# Patient Record
Sex: Male | Born: 2000 | Race: White | Hispanic: Yes | Marital: Single | Smoking: Former smoker
Health system: Southern US, Community
[De-identification: ages and names within clinical notes are randomized; demographics above are authoritative.]

## PROBLEM LIST (undated history)

## (undated) DIAGNOSIS — S060XAA Concussion with loss of consciousness status unknown, initial encounter: Secondary | ICD-10-CM

## (undated) DIAGNOSIS — S060X9A Concussion with loss of consciousness of unspecified duration, initial encounter: Secondary | ICD-10-CM

## (undated) DIAGNOSIS — I10 Essential (primary) hypertension: Secondary | ICD-10-CM

## (undated) HISTORY — PX: NO PAST SURGERIES: SHX2092

## (undated) HISTORY — DX: Essential (primary) hypertension: I10

## (undated) HISTORY — DX: Concussion with loss of consciousness status unknown, initial encounter: S06.0XAA

## (undated) HISTORY — DX: Concussion with loss of consciousness of unspecified duration, initial encounter: S06.0X9A

---

## 2018-03-27 ENCOUNTER — Other Ambulatory Visit: Payer: Self-pay | Admitting: Family Medicine

## 2018-03-27 DIAGNOSIS — S060X0A Concussion without loss of consciousness, initial encounter: Secondary | ICD-10-CM

## 2018-04-02 ENCOUNTER — Ambulatory Visit
Admission: RE | Admit: 2018-04-02 | Discharge: 2018-04-02 | Disposition: A | Discharge status: Home / Self Care | Payer: Managed Care, Other (non HMO) | Source: Ambulatory Visit | Attending: Family Medicine | Admitting: Family Medicine

## 2018-04-02 DIAGNOSIS — S060X0A Concussion without loss of consciousness, initial encounter: Secondary | ICD-10-CM

## 2018-04-05 ENCOUNTER — Ambulatory Visit (INDEPENDENT_AMBULATORY_CARE_PROVIDER_SITE_OTHER): Payer: Managed Care, Other (non HMO) | Admitting: Neurology

## 2018-04-05 ENCOUNTER — Encounter: Payer: Self-pay | Admitting: Neurology

## 2018-04-05 VITALS — BP 125/85 | HR 72 | Ht 75.75 in | Wt 280.0 lb

## 2018-04-05 DIAGNOSIS — IMO0002 Reserved for concepts with insufficient information to code with codable children: Secondary | ICD-10-CM | POA: Insufficient documentation

## 2018-04-05 DIAGNOSIS — G43709 Chronic migraine without aura, not intractable, without status migrainosus: Secondary | ICD-10-CM

## 2018-04-05 MED ORDER — NORTRIPTYLINE HCL 10 MG PO CAPS: 20.0000 mg | ORAL_CAPSULE | Freq: Every day | ORAL | 11 refills | Status: AC

## 2018-04-05 MED ORDER — SUMATRIPTAN SUCCINATE 50 MG PO TABS: 50.0000 mg | ORAL_TABLET | ORAL | 6 refills | Status: AC | PRN

## 2018-04-05 NOTE — Progress Notes (Signed)
PATIENT: Jacob Day DOB: 2001/01/15  Chief Complaint  Patient presents with  . New Patient (Initial Visit)    New room.   Marland Kitchen Headache    Had concussion on 03/01/18. Having headaches sometimes. Getting them every week.      HISTORICAL  Jacob Day is a 17 years old male, seen in request by his primary care physician Dr. Althea Charon, Dominic for evaluation of headaches, initial evaluation was on April 05, 2018. He is accompanied by his atheletic trainer Dafne Katsarou at today's clinical visit.  I have reviewed and summarized the referring note from the referring physician.  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  Since the injury, he complains of excessive fatigue, but also reported that he tends to stay up late watching TV, is able to go back to his academic course without significant difficulty, but he was put off his practice, he began to ease into his practice regularly, but he continue complains of head pressure sensation, 5 out of 10, it does not limit his daily activities.  He reported history of occasional headache in the past, some of the headache has migraine features, lateralized severe pounding headache with associated light noise sensitivity, movement made it worse, he tends to lie down in dark quiet room resting,  I personally reviewed CT head without contrast on April 02, 2018 was normal, other than mild thickening of the ethmoid air cells  REVIEW OF SYSTEMS: Full 14 system review of systems performed and notable only for fatigue, double vision, blurry vision, headaches, dizziness, depression, anxiety decreased  energy, disinterested in activities, racing thoughts All other review of systems were negative.  ALLERGIES: No Known Allergies  HOME MEDICATIONS: No current outpatient medications on file.   No current facility-administered medications for this visit.     PAST MEDICAL HISTORY: Past Medical History:  Diagnosis Date  . Concussion   . Hypertension     PAST SURGICAL HISTORY: Past Surgical History:  Procedure Laterality Date  . NO PAST SURGERIES      FAMILY HISTORY: History reviewed. No pertinent family history.  SOCIAL HISTORY: Social History   Socioeconomic History  . Marital status: Single    Spouse name: Not on file  . Number of children: 0  . Years of education: Current student  . Highest education level: Not on file  Occupational History  . Occupation: Consulting civil engineer  Social Needs  . Financial resource strain: Not on file  . Food insecurity:    Worry: Not on file    Inability: Not on file  . Transportation needs:    Medical: Not on file    Non-medical: Not on file  Tobacco Use  . Smoking status: Former Smoker  Substance and Sexual Activity  . Alcohol use: Yes    Comment: 12-15 beers per month  . Drug use: Never  . Sexual activity: Not on file  Lifestyle  . Physical activity:    Days per week: Not on file    Minutes per session: Not on file  . Stress: Not on file  Relationships  . Social connections:    Talks on phone: Not on file  Gets together: Not on file    Attends religious service: Not on file    Active member of club or organization: Not on file    Attends meetings of clubs or organizations: Not on file    Relationship status: Not on file  . Intimate partner violence:    Fear of current or ex partner: Not on file    Emotionally abused: Not on file    Physically abused: Not on file    Forced sexual activity: Not on file  Other Topics Concern  . Not on file  Social History Narrative   Right handed   Caffeine use: sweet tea sometimes   Lives  with roommate     PHYSICAL EXAM   Vitals:   04/05/18 1432  BP: 125/85  Pulse: 72  SpO2: 98%  Weight: 280 lb (127 kg)  Height: 6' 3.75" (1.924 m)    Not recorded      Body mass index is 34.31 kg/m.  PHYSICAL EXAMNIATION:  Gen: NAD, conversant, well nourised, obese, well groomed                     Cardiovascular: Regular rate rhythm, no peripheral edema, warm, nontender. Eyes: Conjunctivae clear without exudates or hemorrhage Neck: Supple, no carotid bruits. Pulmonary: Clear to auscultation bilaterally   NEUROLOGICAL EXAM:  MENTAL STATUS: Speech:    Speech is normal; fluent and spontaneous with normal comprehension.  Cognition:     Orientation to time, place and person     Normal recent and remote memory     Normal Attention span and concentration     Normal Language, naming, repeating,spontaneous speech     Fund of knowledge   CRANIAL NERVES: CN II: Visual fields are full to confrontation. Fundoscopic exam is normal with sharp discs and no vascular changes. Pupils are round equal and briskly reactive to light. CN III, IV, VI: extraocular movement are normal. No ptosis. CN V: Facial sensation is intact to pinprick in all 3 divisions bilaterally. Corneal responses are intact.  CN VII: Face is symmetric with normal eye closure and smile. CN VIII: Hearing is normal to rubbing fingers CN IX, X: Palate elevates symmetrically. Phonation is normal. CN XI: Head turning and shoulder shrug are intact CN XII: Tongue is midline with normal movements and no atrophy.  MOTOR: There is no pronator drift of out-stretched arms. Muscle bulk and tone are normal. Muscle strength is normal.  REFLEXES: Reflexes are 2+ and symmetric at the biceps, triceps, knees, and ankles. Plantar responses are flexor.  SENSORY: Intact to light touch, pinprick, positional sensation and vibratory sensation are intact in fingers and toes.  COORDINATION: Rapid alternating movements and fine finger  movements are intact. There is no dysmetria on finger-to-nose and heel-knee-shin.    GAIT/STANCE: Posture is normal. Gait is steady with normal steps, base, arm swing, and turning. Heel and toe walking are normal. Tandem gait is normal.  Romberg is absent.   DIAGNOSTIC DATA (LABS, IMAGING, TESTING) - I reviewed patient records, labs, notes, testing and imaging myself where available.   ASSESSMENT AND PLAN  Anshul Meddings is a 17 y.o. male   Concussion on March 01, 2018 Chronic migraine headaches   Start preventive medication nortriptyline 10, titrating to 20 mg every night  Magnesium oxide 400 mg, riboflavin 100 mg twice a Day as migraine prevention  Imitrex 50 mg as needed    Levert Feinstein, M.D. Ph.D.  [facility] [address]  [place], [place] [address] Ph: [phone] Fax: [phone]  CC:  Otho DarnerMcKinley, Dominic W, MD

## 2018-05-11 ENCOUNTER — Other Ambulatory Visit: Payer: Self-pay | Admitting: Family Medicine

## 2018-05-11 DIAGNOSIS — M25511 Pain in right shoulder: Secondary | ICD-10-CM

## 2018-05-23 ENCOUNTER — Ambulatory Visit
Admission: RE | Admit: 2018-05-23 | Discharge: 2018-05-23 | Disposition: A | Discharge status: Home / Self Care | Payer: No Typology Code available for payment source | Source: Ambulatory Visit | Attending: Family Medicine | Admitting: Family Medicine

## 2018-05-23 DIAGNOSIS — M25511 Pain in right shoulder: Secondary | ICD-10-CM

## 2018-05-23 MED ORDER — IOPAMIDOL (ISOVUE-M 200) INJECTION 41%
20.0000 mL | Freq: Once | INTRAMUSCULAR | Status: AC
  Administered 2018-05-23: 20 mL via INTRA_ARTICULAR

## 2019-03-02 ENCOUNTER — Other Ambulatory Visit: Payer: Self-pay

## 2019-03-02 DIAGNOSIS — Z20822 Contact with and (suspected) exposure to covid-19: Secondary | ICD-10-CM

## 2019-03-03 LAB — NOVEL CORONAVIRUS, NAA: SARS-CoV-2, NAA: NOT DETECTED

## 2019-11-11 ENCOUNTER — Other Ambulatory Visit: Payer: Self-pay | Admitting: *Deleted

## 2019-11-11 DIAGNOSIS — U071 COVID-19: Secondary | ICD-10-CM

## 2019-11-28 ENCOUNTER — Other Ambulatory Visit: Payer: No Typology Code available for payment source

## 2019-11-28 ENCOUNTER — Other Ambulatory Visit (HOSPITAL_COMMUNITY): Payer: No Typology Code available for payment source

## 2020-01-28 ENCOUNTER — Ambulatory Visit: Payer: No Typology Code available for payment source | Attending: Family

## 2020-01-28 DIAGNOSIS — Z23 Encounter for immunization: Secondary | ICD-10-CM

## 2020-01-28 NOTE — Progress Notes (Signed)
   Covid-19 Vaccination Clinic  Name:  Jacob Day    MRN: [id number] DOB: 2000/09/07  01/28/2020  Mr. Dengel was observed post Covid-19 immunization for 15 minutes without incident. He was provided with Vaccine Information Sheet and instruction to access the V-Safe system.   Mr. Fuelling was instructed to call 911 with any severe reactions post vaccine: Marland Kitchen Difficulty breathing  . Swelling of face and throat  . A fast heartbeat  . A bad rash all over body  . Dizziness and weakness   Immunizations Administered    Name Date Dose VIS Date Route   Moderna COVID-19 Vaccine 01/28/2020  1:29 PM 0.5 mL 06/2019 Intramuscular   Manufacturer: Moderna   Lot: [id number]   NDC: 11657-903-83

## 2020-02-25 ENCOUNTER — Ambulatory Visit: Payer: No Typology Code available for payment source | Attending: Internal Medicine

## 2020-02-25 DIAGNOSIS — Z23 Encounter for immunization: Secondary | ICD-10-CM

## 2020-02-25 NOTE — Progress Notes (Signed)
   Covid-19 Vaccination Clinic  Name:  Jacob Day    MRN: [id number] DOB: 12-Dec-2000  02/25/2020  Jacob Day was observed post Covid-19 immunization for 15 minutes .  During the observation period, he experienced an adverse reaction with the following symptoms:  dizziness.  Assessment : Time of assessment 12:53. Alert and oriented.  Actions taken:  Vitals sign taken 12:55 BP 135/94  HR 108  O2 Sat 98% 1:05  BP 149/86  HR 91  O2 Sat 96    Medications administered: No medication administered.  Disposition: Reports no further symptoms of adverse reaction after observation for 15 minutes. Discharged home.   Immunizations Administered    Name Date Dose VIS Date Route   Moderna COVID-19 Vaccine 02/25/2020 12:37 PM 0.5 mL 06/2019 Intramuscular   Manufacturer: Moderna   Lot: [id number]   NDC: 99371-696-78

## 2020-03-09 ENCOUNTER — Telehealth: Payer: Self-pay | Admitting: *Deleted

## 2020-03-09 NOTE — Telephone Encounter (Signed)
Called patient in regards to the second COVID vaccine that he received on 02/18/20. Patient stated that he is fine and did not have any further issues. No allergic reaction detected.

## 2020-10-12 IMAGING — XA DG FLUORO GUIDE NDL PLC/BX
1 series · 1 of 1 positions shown · non-contrast
Comparison: none

CLINICAL DATA: Possible dislocation injury.  Football player.

[Series 1: ortho standard · 1 of 1 slices shown]
[im 1/1]
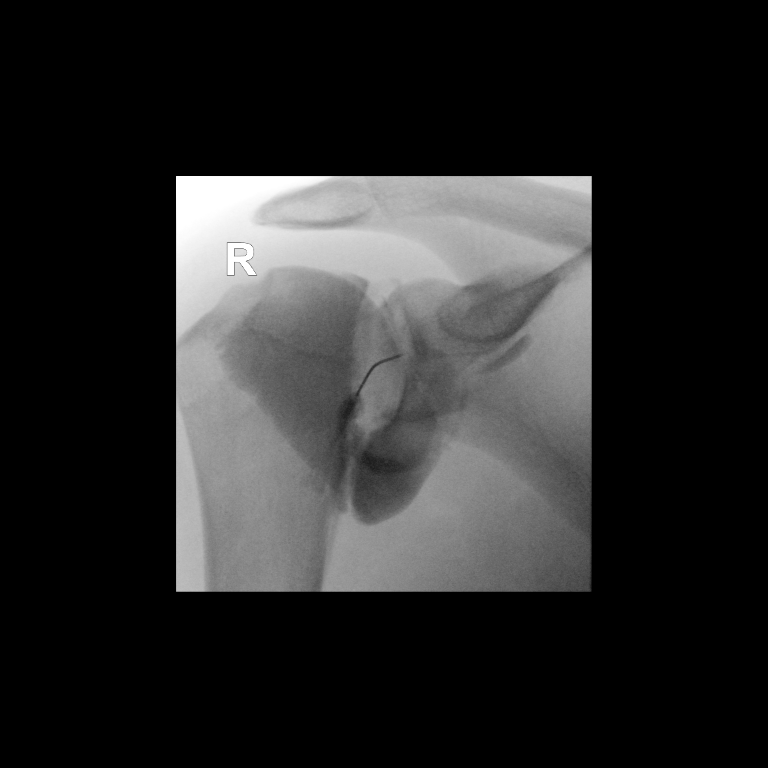

[1 of 1 positions shown; findings below may reference images not displayed]

FLUOROSCOPY TIME:  0 minutes 13 seconds. 19.99 micro gray meter
squared

PROCEDURE:
Right SHOULDER INJECTION UNDER FLUOROSCOPY

The skin overlying the shoulder was scrubbed with Betadine and
draped in sterile fashion. Skin and subcutaneous anesthesia was
carried out using a 25 gauge needle and 1% lidocaine. A 22 gauge
spinal needle was directed under fluoroscopic guidance on one pass
into the glenohumeral joint. 20 cc of a mixture of 0.1 cc MultiHance
and dilute Isovue 200 was then used to fill the glenohumeral joint.
IMPRESSION: Technically successful right shoulder injection for MRI.

## 2023-06-27 ENCOUNTER — Other Ambulatory Visit: Payer: Self-pay | Admitting: Family Medicine

## 2023-06-27 DIAGNOSIS — M545 Low back pain, unspecified: Secondary | ICD-10-CM

## 2023-07-27 NOTE — Discharge Instructions (Signed)

## 2023-07-28 ENCOUNTER — Ambulatory Visit
Admission: RE | Admit: 2023-07-28 | Discharge: 2023-07-28 | Disposition: A | Discharge status: Home / Self Care | Payer: BC Managed Care – PPO | Source: Ambulatory Visit | Attending: Family Medicine | Admitting: Family Medicine

## 2023-07-28 DIAGNOSIS — M545 Low back pain, unspecified: Secondary | ICD-10-CM

## 2023-07-28 MED ORDER — IOPAMIDOL (ISOVUE-M 200) INJECTION 41%
1.0000 mL | Freq: Once | INTRAMUSCULAR | Status: AC
  Administered 2023-07-28: 1 mL via EPIDURAL

## 2023-07-28 MED ORDER — METHYLPREDNISOLONE ACETATE 40 MG/ML INJ SUSP (RADIOLOG
80.0000 mg | Freq: Once | INTRAMUSCULAR | Status: AC
  Administered 2023-07-28: 80 mg via EPIDURAL

## 2024-01-22 ENCOUNTER — Other Ambulatory Visit: Payer: Self-pay | Admitting: Family Medicine

## 2024-01-22 DIAGNOSIS — M545 Low back pain, unspecified: Secondary | ICD-10-CM

## 2024-02-01 NOTE — Discharge Instructions (Signed)

## 2024-02-02 ENCOUNTER — Ambulatory Visit
Admission: RE | Admit: 2024-02-02 | Discharge: 2024-02-02 | Disposition: A | Discharge status: Home / Self Care | Source: Ambulatory Visit | Attending: Family Medicine | Admitting: Family Medicine

## 2024-02-02 DIAGNOSIS — M545 Low back pain, unspecified: Secondary | ICD-10-CM

## 2024-02-02 MED ORDER — IOPAMIDOL (ISOVUE-M 200) INJECTION 41%
1.0000 mL | Freq: Once | INTRAMUSCULAR | Status: AC
  Administered 2024-02-02: 1 mL via EPIDURAL

## 2024-02-02 MED ORDER — METHYLPREDNISOLONE ACETATE 40 MG/ML INJ SUSP (RADIOLOG
80.0000 mg | Freq: Once | INTRAMUSCULAR | Status: AC
  Administered 2024-02-02: 80 mg via EPIDURAL
